# Patient Record
Sex: Male | Born: 1960 | Race: Asian | Hispanic: No | Marital: Married | Smoking: Never smoker
Health system: Southern US, Community
[De-identification: ages and names within clinical notes are randomized; demographics above are authoritative.]

## PROBLEM LIST (undated history)

## (undated) DIAGNOSIS — I10 Essential (primary) hypertension: Secondary | ICD-10-CM

## (undated) HISTORY — PX: COLONOSCOPY: SHX5424

## (undated) HISTORY — PX: UPPER GASTROINTESTINAL ENDOSCOPY: SHX188

---

## 2003-06-07 ENCOUNTER — Emergency Department (HOSPITAL_COMMUNITY): Admission: AD | Admit: 2003-06-07 | Discharge: 2003-06-07 | Payer: Self-pay | Admitting: Family Medicine

## 2005-03-02 ENCOUNTER — Encounter: Admission: RE | Admit: 2005-03-02 | Discharge: 2005-03-02 | Payer: Self-pay | Admitting: Internal Medicine

## 2009-07-18 ENCOUNTER — Emergency Department (HOSPITAL_COMMUNITY): Admission: EM | Admit: 2009-07-18 | Discharge: 2009-07-18 | Payer: Self-pay | Admitting: Emergency Medicine

## 2009-11-03 ENCOUNTER — Encounter: Admission: RE | Admit: 2009-11-03 | Discharge: 2009-11-03 | Payer: Self-pay | Admitting: Specialist

## 2011-08-21 ENCOUNTER — Other Ambulatory Visit: Payer: Self-pay | Admitting: Internal Medicine

## 2011-08-21 DIAGNOSIS — R634 Abnormal weight loss: Secondary | ICD-10-CM

## 2011-08-27 ENCOUNTER — Ambulatory Visit
Admission: RE | Admit: 2011-08-27 | Discharge: 2011-08-27 | Disposition: A | Discharge status: Home / Self Care | Payer: BC Managed Care – PPO | Source: Ambulatory Visit | Attending: Internal Medicine | Admitting: Internal Medicine

## 2011-08-27 DIAGNOSIS — R634 Abnormal weight loss: Secondary | ICD-10-CM

## 2011-08-27 MED ORDER — IOHEXOL 300 MG/ML  SOLN
100.0000 mL | Freq: Once | INTRAMUSCULAR | Status: AC | PRN
  Administered 2011-08-27: 100 mL via INTRAVENOUS

## 2013-03-09 IMAGING — CT CT ABDOMEN W/ CM
3 of 5 series · 13 of 36 positions shown, 19 images · IV contrast (omnipaque)
Comparison: None.

CLINICAL DATA: Weight loss.

CT ABDOMEN WITH CONTRAST
TECHNIQUE: Multidetector CT imaging of the abdomen was performed
using the standard protocol following bolus administration of
intravenous contrast.
Contrast: 100mL OMNIPAQUE IOHEXOL 300 MG/ML  SOLN

[Series 3: abdomen with · axial · 0.70mm/px · z∈[-194,-64]mm · 4 of 44 slices shown, 9 images]
[im 9/44  soft-tissue]
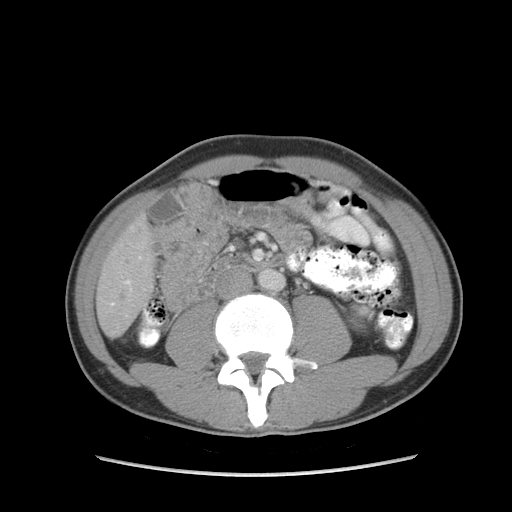
[im 9/44  lung]
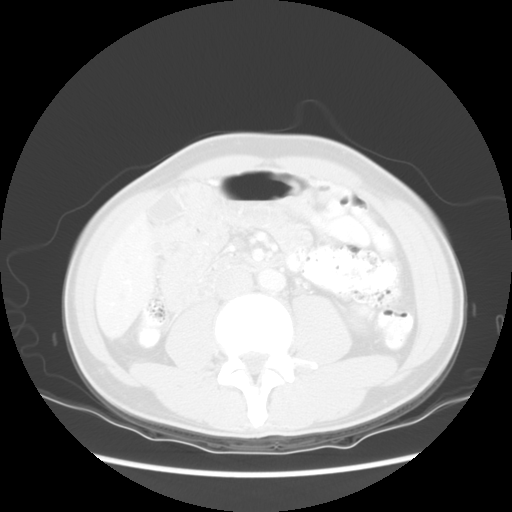
[im 9/44  bone]
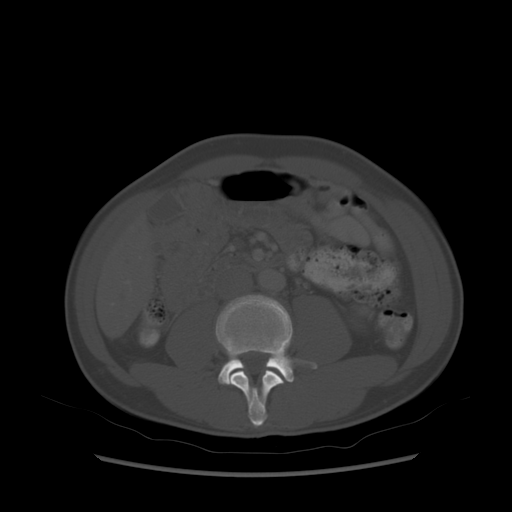
[im 18/44  soft-tissue]
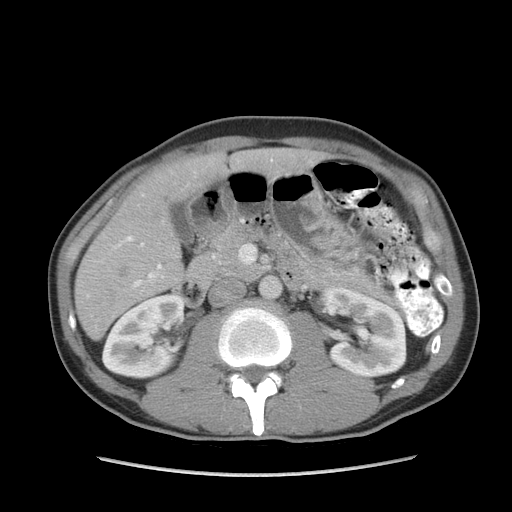
[im 18/44  lung]
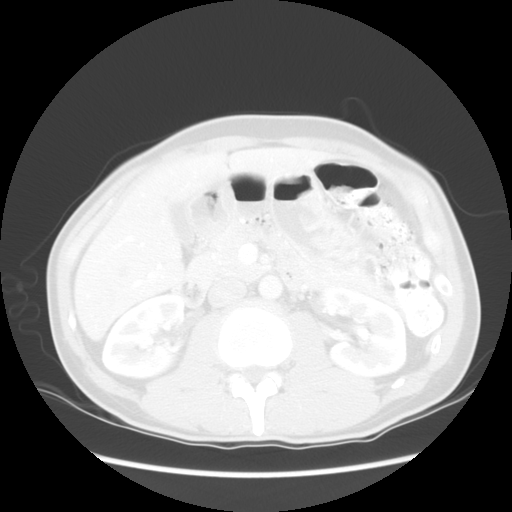
[im 26/44  soft-tissue]
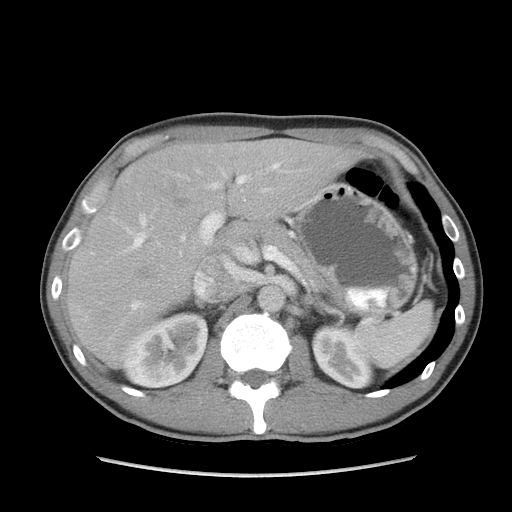
[im 26/44  lung]
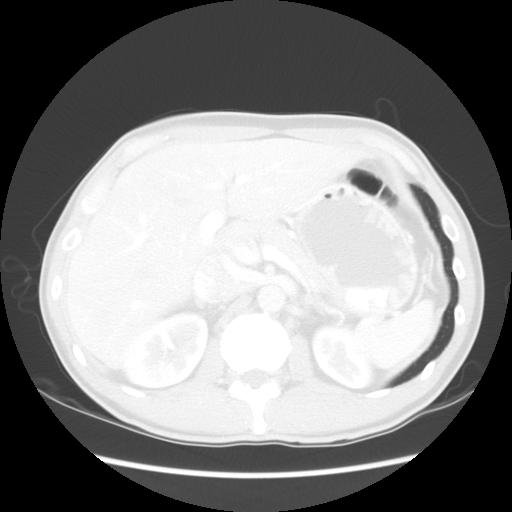
[im 35/44  soft-tissue]
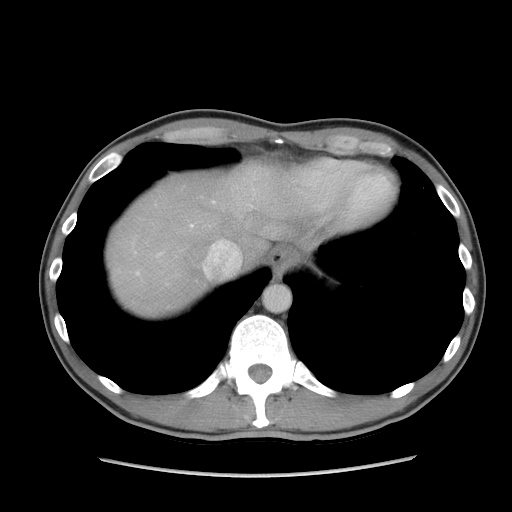
[im 35/44  lung]
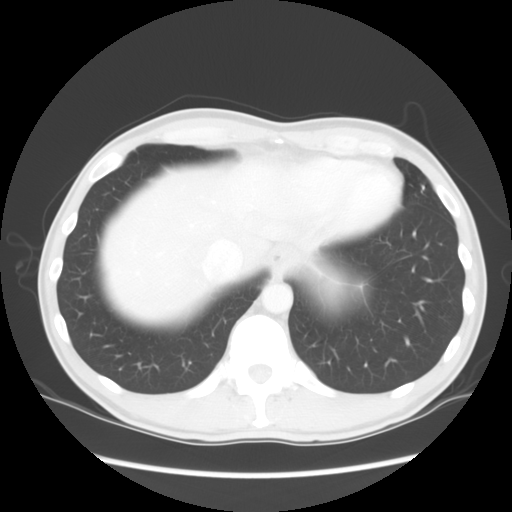

[Series 601: coronal body · coronal · 0.70mm/px · 1 of 100 slices shown, 2 images]
[im 34/100  soft-tissue]
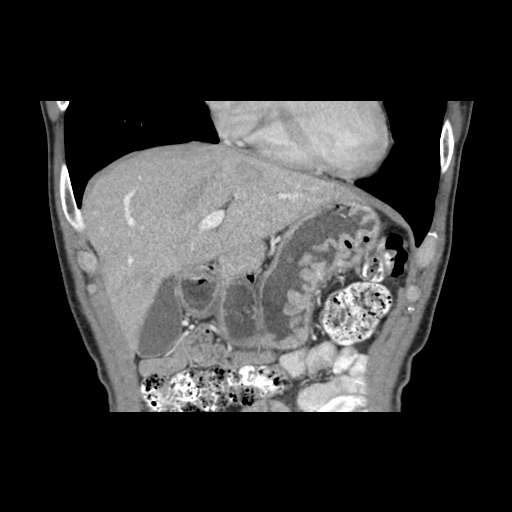
[im 34/100  bone]
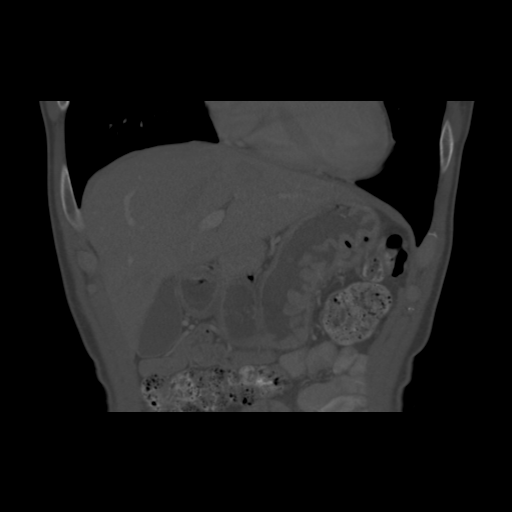

[Series 602: sagittal body · sagittal · 0.70mm/px · 8 of 131 slices shown]
[im 15/131  soft-tissue]
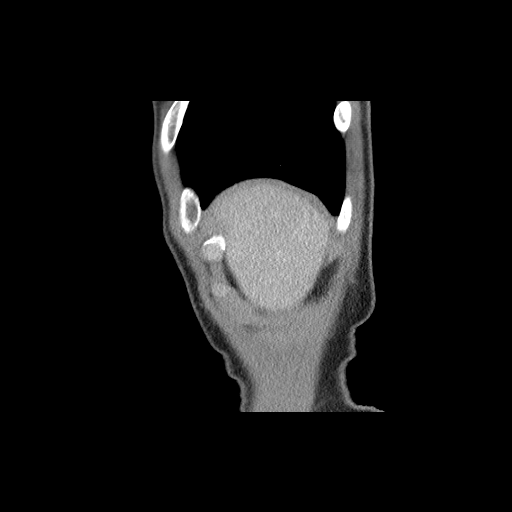
[im 29/131  soft-tissue]
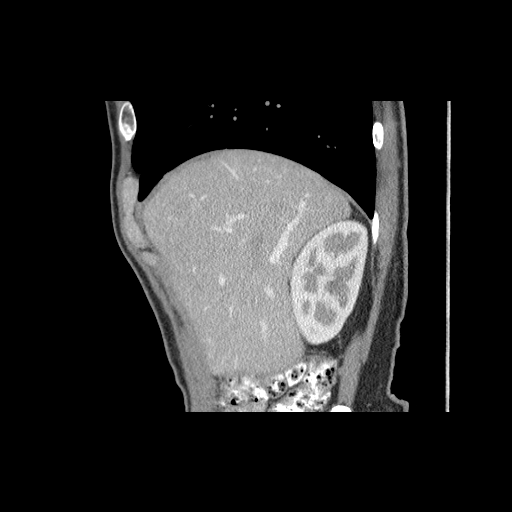
[im 44/131  soft-tissue]
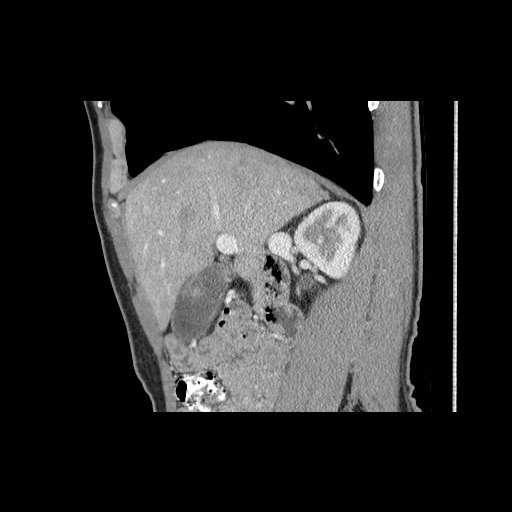
[im 58/131  soft-tissue]
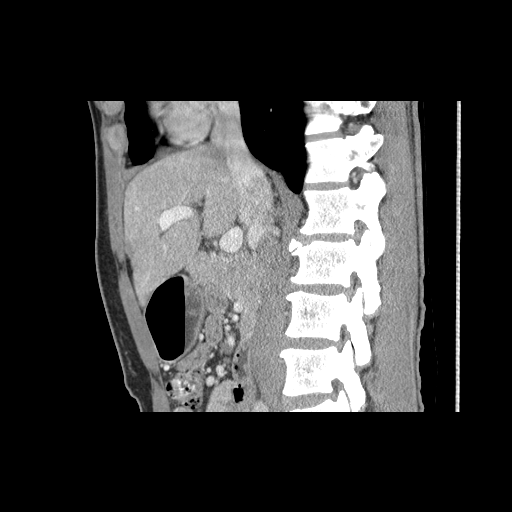
[im 73/131  soft-tissue]
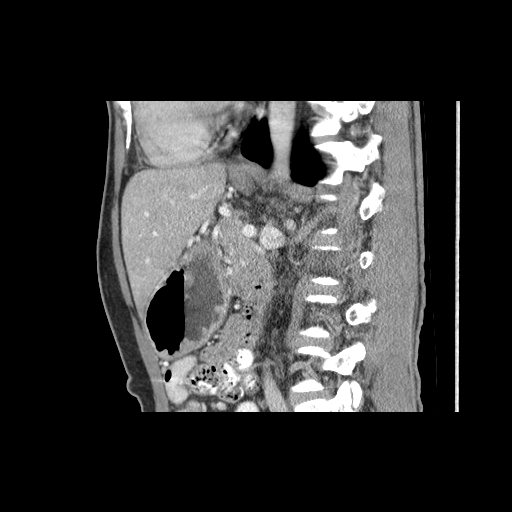
[im 87/131  soft-tissue]
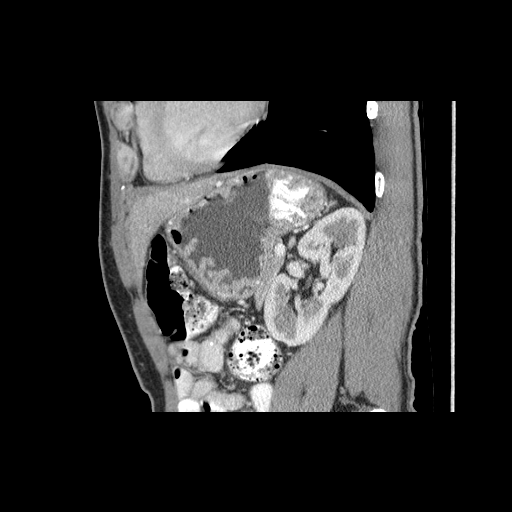
[im 102/131  soft-tissue]
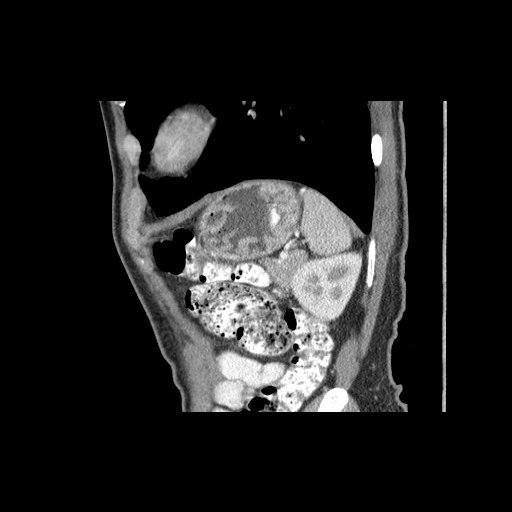
[im 116/131  soft-tissue]
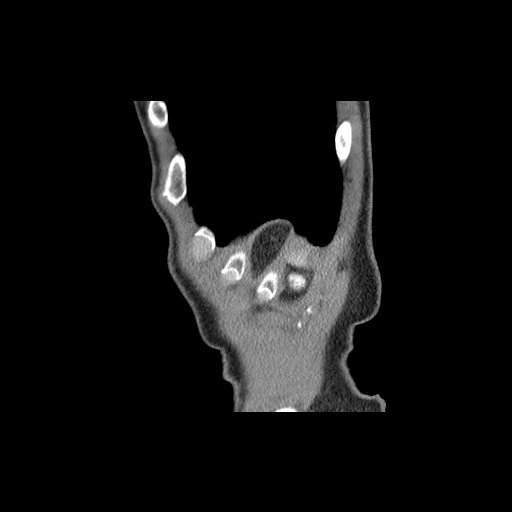

[13 of 36 positions shown; findings below may reference images not displayed]

FINDINGS: Lung bases show a 4 mm nodule in the right middle lobe
(image 4).  Heart size normal.  No pericardial or pleural effusion.

Liver, gallbladder and right adrenal gland are unremarkable.  There
may be a 1.4 x 0.8 cm nodule in the left adrenal gland.  Kidneys,
spleen, pancreas, stomach and visualized bowel are otherwise
unremarkable.  No pathologically enlarged lymph nodes.  No free
fluid.  No worrisome lytic or sclerotic lesions.
IMPRESSION: 1.  No acute findings.  No findings to explain the patient's weight
loss.
2.  Possible left adrenal nodule, indeterminate.
3.  Tiny right middle lobe nodule. If the patient is at high risk
for bronchogenic carcinoma, follow-up chest CT at 1 year is
recommended.  If the patient is at low risk, no follow-up is
needed.  This recommendation follows the consensus statement:
Guidelines for Management of Small Pulmonary Nodules Detected on CT
Scans:  A Statement from the [HOSPITAL] as published in

## 2014-09-29 ENCOUNTER — Encounter (HOSPITAL_COMMUNITY): Payer: Self-pay | Admitting: *Deleted

## 2014-09-29 ENCOUNTER — Emergency Department (HOSPITAL_COMMUNITY)
Admission: EM | Admit: 2014-09-29 | Discharge: 2014-09-29 | Disposition: A | Discharge status: Home / Self Care | Payer: BLUE CROSS/BLUE SHIELD | Source: Home / Self Care | Attending: Family Medicine | Admitting: Family Medicine

## 2014-09-29 DIAGNOSIS — B029 Zoster without complications: Secondary | ICD-10-CM | POA: Diagnosis not present

## 2014-09-29 HISTORY — DX: Essential (primary) hypertension: I10

## 2014-09-29 MED ORDER — TRAMADOL HCL 50 MG PO TABS: 50.0000 mg | ORAL_TABLET | Freq: Four times a day (QID) | ORAL | Status: DC | PRN

## 2014-09-29 MED ORDER — VALACYCLOVIR HCL 1 G PO TABS: 1000.0000 mg | ORAL_TABLET | Freq: Three times a day (TID) | ORAL | Status: AC

## 2014-09-29 NOTE — ED Provider Notes (Signed)
CSN: [id number]     Arrival date & time 09/29/14  1037 History   First MD Initiated Contact with Patient 09/29/14 1256     Chief Complaint  Patient presents with  . Back Pain   (Consider location/radiation/quality/duration/timing/severity/associated sxs/prior Treatment) HPI Comments: Developed nonspecific back discomfort on 09/27/2014 and discovered erythematous vesicular rash on left torso yesterday. Hx of shingles 20 years ago. Recently returned from multiple international business trips. PCP: Dr. Donette LarryHusain  Patient is a 54 y.o. male presenting with back pain. The history is provided by the patient.  Back Pain   Past Medical History  Diagnosis Date  . Hypertension    History reviewed. No pertinent past surgical history. History reviewed. No pertinent family history. History  Substance Use Topics  . Smoking status: Never Smoker   . Smokeless tobacco: Not on file  . Alcohol Use: Yes    Review of Systems  Musculoskeletal: Positive for back pain.  All other systems reviewed and are negative.   Allergies  Review of patient's allergies indicates no known allergies.  Home Medications   Prior to Admission medications   Medication Sig Start Date End Date Taking? Authorizing Provider  telmisartan (MICARDIS) 40 MG tablet Take 40 mg by mouth daily.   Yes Historical Provider, MD  traMADol (ULTRAM) 50 MG tablet Take 1 tablet (50 mg total) by mouth every 6 (six) hours as needed for moderate pain or severe pain. 09/29/14   Mathis FareJennifer Lee H Amariss Detamore, PA  valACYclovir (VALTREX) 1000 MG tablet Take 1 tablet (1,000 mg total) by mouth 3 (three) times daily. 09/29/14 10/13/14  Jess BartersJennifer Lee H Alicea Wente, PA   BP 124/74 mmHg  Pulse 78  Temp(Src) 98.6 F (37 C) (Oral)  Resp 18  SpO2 100% Physical Exam  Constitutional: He is oriented to person, place, and time. He appears well-developed and well-nourished. No distress.  HENT:  Head: Normocephalic and atraumatic.  Cardiovascular: Normal rate.    Pulmonary/Chest: Effort normal.  Musculoskeletal: Normal range of motion.  Neurological: He is alert and oriented to person, place, and time.  Skin: Skin is warm and dry. Rash noted.  Linear distribution of vesicular rash on erythematous base along left L1 dermatome  Psychiatric: He has a normal mood and affect. His behavior is normal.  Nursing note reviewed.   ED Course  Procedures (including critical care time) Labs Review Labs Reviewed - No data to display  Imaging Review No results found.   MDM   1. Herpes zoster   Valtrex Tramadol pcp follow up  Ria ClockJennifer Lee H Blayke Cordrey, PA 09/29/14 1347

## 2014-09-29 NOTE — Discharge Instructions (Signed)

## 2014-09-29 NOTE — ED Notes (Signed)
Pt  Reports   Symptoms  Of   A  Rash  That  Runs  Around  l  Side      And  l  Hip area      That  He  Noticed  Sv  Days  Ago -   He  Also  Reports       Some  Back  Pain                   As  Well     Recent     Travel  To  [place]         The  Rash   Does  Not  Really  Itch       He  Has  Had  Shingles  In  Past

## 2014-09-29 NOTE — ED Notes (Signed)
This Clinical research associatewriter charted in error on this patient

## 2015-07-14 ENCOUNTER — Other Ambulatory Visit: Payer: Self-pay | Admitting: Gastroenterology

## 2015-08-19 ENCOUNTER — Encounter (HOSPITAL_COMMUNITY): Payer: Self-pay | Admitting: *Deleted

## 2015-08-22 ENCOUNTER — Ambulatory Visit (HOSPITAL_COMMUNITY): Payer: BLUE CROSS/BLUE SHIELD | Admitting: Certified Registered Nurse Anesthetist

## 2015-08-22 ENCOUNTER — Ambulatory Visit (HOSPITAL_COMMUNITY)
Admission: RE | Admit: 2015-08-22 | Discharge: 2015-08-22 | Disposition: A | Discharge status: Home / Self Care | Payer: BLUE CROSS/BLUE SHIELD | Source: Ambulatory Visit | Attending: Gastroenterology | Admitting: Gastroenterology

## 2015-08-22 ENCOUNTER — Encounter (HOSPITAL_COMMUNITY)
Admission: RE | Disposition: A | Discharge status: Home / Self Care | Payer: Self-pay | Source: Ambulatory Visit | Attending: Gastroenterology

## 2015-08-22 ENCOUNTER — Encounter (HOSPITAL_COMMUNITY): Payer: Self-pay

## 2015-08-22 DIAGNOSIS — Z1211 Encounter for screening for malignant neoplasm of colon: Secondary | ICD-10-CM | POA: Insufficient documentation

## 2015-08-22 DIAGNOSIS — Z79899 Other long term (current) drug therapy: Secondary | ICD-10-CM | POA: Insufficient documentation

## 2015-08-22 DIAGNOSIS — I10 Essential (primary) hypertension: Secondary | ICD-10-CM | POA: Diagnosis not present

## 2015-08-22 HISTORY — PX: COLONOSCOPY WITH PROPOFOL: SHX5780

## 2015-08-22 SURGERY — COLONOSCOPY WITH PROPOFOL
Anesthesia: Monitor Anesthesia Care

## 2015-08-22 MED ORDER — LIDOCAINE HCL (CARDIAC) 20 MG/ML IV SOLN
INTRAVENOUS | Status: AC
  Filled 2015-08-22: qty 10

## 2015-08-22 MED ORDER — PROPOFOL 10 MG/ML IV BOLUS
INTRAVENOUS | Status: AC
  Filled 2015-08-22: qty 20

## 2015-08-22 MED ORDER — ONDANSETRON HCL 4 MG/2ML IJ SOLN
INTRAMUSCULAR | Status: DC | PRN
  Administered 2015-08-22: 4 mg via INTRAVENOUS

## 2015-08-22 MED ORDER — LACTATED RINGERS IV SOLN
INTRAVENOUS | Status: DC | PRN
  Administered 2015-08-22: 13:00:00 via INTRAVENOUS

## 2015-08-22 MED ORDER — LIDOCAINE HCL (CARDIAC) 20 MG/ML IV SOLN
INTRAVENOUS | Status: DC | PRN
  Administered 2015-08-22: 100 mg via INTRAVENOUS

## 2015-08-22 MED ORDER — SODIUM CHLORIDE 0.9 % IV SOLN: INTRAVENOUS | Status: DC

## 2015-08-22 MED ORDER — PROPOFOL 10 MG/ML IV BOLUS
INTRAVENOUS | Status: AC
  Filled 2015-08-22: qty 40

## 2015-08-22 MED ORDER — EPHEDRINE SULFATE 50 MG/ML IJ SOLN
INTRAMUSCULAR | Status: DC | PRN
  Administered 2015-08-22: 5 mg via INTRAVENOUS

## 2015-08-22 MED ORDER — LIDOCAINE HCL (CARDIAC) 20 MG/ML IV SOLN
INTRAVENOUS | Status: AC
  Filled 2015-08-22: qty 5

## 2015-08-22 MED ORDER — PROPOFOL 10 MG/ML IV BOLUS
INTRAVENOUS | Status: DC | PRN
  Administered 2015-08-22 (×2): 10 mg via INTRAVENOUS
  Administered 2015-08-22 (×2): 20 mg via INTRAVENOUS

## 2015-08-22 MED ORDER — PROPOFOL 500 MG/50ML IV EMUL
INTRAVENOUS | Status: DC | PRN
  Administered 2015-08-22: 75 ug/kg/min via INTRAVENOUS

## 2015-08-22 MED ORDER — ONDANSETRON HCL 4 MG/2ML IJ SOLN
INTRAMUSCULAR | Status: AC
  Filled 2015-08-22: qty 2

## 2015-08-22 SURGICAL SUPPLY — 21 items

## 2015-08-22 NOTE — Anesthesia Preprocedure Evaluation (Signed)
Anesthesia Evaluation  Patient identified by MRN, date of birth, ID band Patient awake    Reviewed: Allergy & Precautions, NPO status , Patient's Chart, lab work & pertinent test results  Airway Mallampati: II  TM Distance: >3 FB Neck ROM: Full    Dental no notable dental hx.    Pulmonary neg pulmonary ROS,    Pulmonary exam normal breath sounds clear to auscultation       Cardiovascular hypertension, Pt. on medications negative cardio ROS Normal cardiovascular exam Rhythm:Regular Rate:Normal     Neuro/Psych negative neurological ROS  negative psych ROS   GI/Hepatic negative GI ROS, Neg liver ROS,   Endo/Other  negative endocrine ROS  Renal/GU negative Renal ROS  negative genitourinary   Musculoskeletal negative musculoskeletal ROS (+)   Abdominal   Peds negative pediatric ROS (+)  Hematology negative hematology ROS (+)   Anesthesia Other Findings   Reproductive/Obstetrics negative OB ROS                             Anesthesia Physical Anesthesia Plan  ASA: II  Anesthesia Plan: MAC   Post-op Pain Management:    Induction: Intravenous  Airway Management Planned: Simple Face Mask  Additional Equipment:   Intra-op Plan:   Post-operative Plan: Extubation in OR  Informed Consent: I have reviewed the patients History and Physical, chart, labs and discussed the procedure including the risks, benefits and alternatives for the proposed anesthesia with the patient or authorized representative who has indicated his/her understanding and acceptance.   Dental advisory given  Plan Discussed with: CRNA  Anesthesia Plan Comments:         Anesthesia Quick Evaluation

## 2015-08-22 NOTE — Anesthesia Postprocedure Evaluation (Signed)
Anesthesia Post Note  Patient: Charm RingsYasushi Eisenberg  Procedure(s) Performed: Procedure(s) (LRB): COLONOSCOPY WITH PROPOFOL (N/A)  Patient location during evaluation: Endoscopy Anesthesia Type: MAC Level of consciousness: awake and alert Pain management: pain level controlled Vital Signs Assessment: post-procedure vital signs reviewed and stable Respiratory status: spontaneous breathing, nonlabored ventilation, respiratory function stable and patient connected to nasal cannula oxygen Cardiovascular status: stable and blood pressure returned to baseline Anesthetic complications: no    Last Vitals:  Filed Vitals:   08/22/15 1320 08/22/15 1330  BP: 105/60 126/70  Pulse: 51 61  Temp:    Resp: 13 18    Last Pain: There were no vitals filed for this visit.               Phillips Groutarignan, Jeramy Dimmick

## 2015-08-22 NOTE — H&P (Signed)
  Procedure: Screening colonoscopy. Normal screening colonoscopy performed on 12/25/2005  History: The patient is a 55 year old male born [birth year]. He is scheduled to undergo a repeat screening colonoscopy today  Past medical history: Hypertension. Gastroesophageal reflux. H. pylori gastritis diagnosed in July 2013.  Exam: Patient is alert and lying comfortably on the endoscopy stretcher. Abdomen is soft and nontender to palpation. Lungs are clear to auscultation. Cardiac exam reveals a regular rhythm.  Plan: Proceed with screening colonoscopy

## 2015-08-22 NOTE — Transfer of Care (Signed)
Immediate Anesthesia Transfer of Care Note  Patient: Cory Griffith  Procedure(s) Performed: Procedure(s): COLONOSCOPY WITH PROPOFOL (N/A)  Patient Location: ENDO  Anesthesia Type:MAC  Level of Consciousness:  sedated, patient cooperative and responds to stimulation  Airway & Oxygen Therapy:Patient Spontanous Breathing and Patient connected to face mask oxgen  Post-op Assessment:  Report given to ENDO RN and Post -op Vital signs reviewed and stable  Post vital signs:  Reviewed and stable  Last Vitals:  Filed Vitals:   08/22/15 1133  BP: 143/70  Pulse: 69  Temp: 37 C  Resp: 16    Complications: No apparent anesthesia complications

## 2015-08-22 NOTE — Op Note (Signed)
[facility] Patient Name: Cory Griffith Procedure Date: 08/22/2015 MRN: [id number] Attending MD: Charolett Bumpers , MD Date of Birth: 08-23-1960 CSN:  Age: 55 Admit Type: Outpatient Procedure:                Colonoscopy Indications:              Screening for colorectal malignant neoplasm. Normal                            screening colonoscopy was performed on 12/25/2005. Providers:                Charolett Bumpers, MD, Omelia Blackwater, RN, Oletha Blend, Technician Referring MD:              Medicines:                Propofol per Anesthesia Complications:            No immediate complications. Estimated Blood Loss:     Estimated blood loss: none. Procedure:                Pre-Anesthesia Assessment:                           - Prior to the procedure, a History and Physical                            was performed, and patient medications and                            allergies were reviewed. The patient's tolerance of                            previous anesthesia was also reviewed. The risks                            and benefits of the procedure and the sedation                            options and risks were discussed with the patient.                            All questions were answered, and informed consent                            was obtained. Prior Anticoagulants: The patient has                            taken no previous anticoagulant or antiplatelet                            agents. ASA Grade Assessment: II - A patient with  mild systemic disease. After reviewing the risks                            and benefits, the patient was deemed in                            satisfactory condition to undergo the procedure.                           After obtaining informed consent, the colonoscope                            was passed under direct vision. Throughout the   procedure, the patient's blood pressure, pulse, and                            oxygen saturations were monitored continuously. The                            EC-[id number] ([id number]) scope was introduced through                            the anus and advanced to the the cecum, identified                            by appendiceal orifice and ileocecal valve. The                            colonoscopy was performed without difficulty. The                            patient tolerated the procedure well. The quality                            of the bowel preparation was good. The appendiceal                            orifice and the rectum were photographed. Scope In: 12:47:02 PM Scope Out: 1:06:45 PM Scope Withdrawal Time: 0 hours 7 minutes 23 seconds  Total Procedure Duration: 0 hours 19 minutes 43 seconds  Findings:      The perianal and digital rectal examinations were normal.      The entire examined colon appeared normal. Impression:               - The entire examined colon is normal.                           - No specimens collected. Moderate Sedation:      N/A- Per Anesthesia Care Recommendation:           - Repeat colonoscopy in 10 years for screening                            purposes.                           -  Patient has a contact number available for                            emergencies. The signs and symptoms of potential                            delayed complications were discussed with the                            patient. Return to normal activities tomorrow.                            Written discharge instructions were provided to the                            patient.                           - Resume previous diet.                           - Continue present medications. Procedure Code(s):        --- Professional ---                           [id number], Colonoscopy, flexible; diagnostic, including                            collection of specimen(s) by  brushing or washing,                            when performed (separate procedure) Diagnosis Code(s):        --- Professional ---                           Z12.11, Encounter for screening for malignant                            neoplasm of colon CPT copyright 2016 [employer]. All rights reserved. The codes documented in this report are preliminary and upon coder review may  be revised to meet current compliance requirements. Danise EdgeMartin Johnson, MD Charolett BumpersMartin K Johnson, MD 08/22/2015 1:11:59 PM This report has been signed electronically. Number of Addenda: 0

## 2015-08-22 NOTE — Discharge Instructions (Signed)

## 2015-08-24 ENCOUNTER — Encounter (HOSPITAL_COMMUNITY): Payer: Self-pay | Admitting: Gastroenterology

## 2016-07-11 ENCOUNTER — Ambulatory Visit (INDEPENDENT_AMBULATORY_CARE_PROVIDER_SITE_OTHER): Payer: BLUE CROSS/BLUE SHIELD | Admitting: Podiatry

## 2016-07-11 VITALS — Resp 16

## 2016-07-11 DIAGNOSIS — B351 Tinea unguium: Secondary | ICD-10-CM

## 2016-07-11 NOTE — Progress Notes (Signed)
Subjective:     Patient ID: Cory RingsYasushi Griffith, male   DOB: [birth year], 58 y.o.   MRN: [id number]  HPI patient presents with discolored nails on the left one through 5 that are dystrophic and thickened. States that this is been going on for a long time   Review of Systems  All other systems reviewed and are negative.      Objective:   Physical Exam  Constitutional: He is oriented to person, place, and time.  Cardiovascular: Intact distal pulses.   Musculoskeletal: Normal range of motion.  Neurological: He is oriented to person, place, and time.  Skin: Skin is warm and dry.  Nursing note and vitals reviewed.  neurovascular status intact muscle strength was adequate with patient found to have yellow dystrophic nailbeds 1 through 5 left with thickening of the beds and mild odor. There is no proximal redness or change and patient's found have good digital perfusion well oriented 3     Assessment:     To be mycotic nail infection 1 through 5 left    Plan:     Reviewed condition and did H&P and at this point I did go ahead and send off for pathology and cultures and did cultures today and pathology today. I will get results we will determine what may be necessary and may require combination of oral therapy fungal therapy and topical

## 2016-08-09 ENCOUNTER — Ambulatory Visit (INDEPENDENT_AMBULATORY_CARE_PROVIDER_SITE_OTHER): Payer: BLUE CROSS/BLUE SHIELD | Admitting: Podiatry

## 2016-08-09 ENCOUNTER — Encounter: Payer: Self-pay | Admitting: Podiatry

## 2016-08-09 DIAGNOSIS — B351 Tinea unguium: Secondary | ICD-10-CM

## 2016-08-09 LAB — HEPATIC FUNCTION PANEL
ALK PHOS: 49 U/L (ref 40–115)
ALT: 17 U/L (ref 9–46)
AST: 26 U/L (ref 10–35)
Albumin: 4 g/dL (ref 3.6–5.1)
BILIRUBIN DIRECT: 0.2 mg/dL (ref <=0.2)
BILIRUBIN TOTAL: 0.8 mg/dL (ref 0.2–1.2)
Indirect Bilirubin: 0.6 mg/dL (ref 0.2–1.2)
Total Protein: 7 g/dL (ref 6.1–8.1)

## 2016-08-09 MED ORDER — TERBINAFINE HCL 250 MG PO TABS: 250.0000 mg | ORAL_TABLET | Freq: Every day | ORAL | 0 refills | Status: AC

## 2016-08-10 NOTE — Progress Notes (Signed)
Subjective:     Patient ID: Cory Griffith, male   DOB: May 29, 1960, 56 y.o.   MRN: [id number]  HPI patient states he is here to review the results of his cultures and to decide on a treatment plan   Review of Systems     Objective:   Physical Exam Neurovascular status intact muscle strength was adequate patient found to have yellow brittle nails 1-5 both feet that are mostly on the distal one half of the nailbed as far as involvement    Assessment:     Mycotic nail infection bilateral    Plan:     Reviewed findings indicating fungus. I have recommended antifungal and I discussed Lamisil and risk and he wants this done and I did sent for liver function study. I then discussed laser and topical and he wants to pursue this course and he is scheduled for laser therapy to be started in the next week with approximate 6 treatments which will be necessary

## 2016-08-15 ENCOUNTER — Ambulatory Visit (INDEPENDENT_AMBULATORY_CARE_PROVIDER_SITE_OTHER): Payer: Self-pay

## 2016-08-15 DIAGNOSIS — B351 Tinea unguium: Secondary | ICD-10-CM

## 2016-08-16 NOTE — Progress Notes (Signed)
Pt presents with mycotic infection of nails 1-5 bilateral  All other systems are negative  Laser therapy administered to affected nails and tolerated well. All safety precautions were in place.Re-appointed in 1 month for 2nd treatment 

## 2016-09-13 ENCOUNTER — Other Ambulatory Visit: Payer: BLUE CROSS/BLUE SHIELD

## 2016-09-14 ENCOUNTER — Ambulatory Visit (INDEPENDENT_AMBULATORY_CARE_PROVIDER_SITE_OTHER): Payer: Self-pay | Admitting: Podiatry

## 2016-09-14 DIAGNOSIS — R52 Pain, unspecified: Secondary | ICD-10-CM

## 2016-09-14 DIAGNOSIS — B351 Tinea unguium: Secondary | ICD-10-CM

## 2016-09-14 NOTE — Progress Notes (Signed)
Pt presents with mycotic infection of nails 1-5 bilateral   All other systems are negative  Laser therapy administered to affected nails and tolerated well. All safety precautions were in place. Re-appointed in 1 month for 3rd treatment 

## 2016-10-15 ENCOUNTER — Ambulatory Visit: Payer: Self-pay | Admitting: Podiatry

## 2016-10-15 DIAGNOSIS — B351 Tinea unguium: Secondary | ICD-10-CM

## 2016-10-30 NOTE — Progress Notes (Signed)
Pt presents with mycotic infection of nails 1-5 bilateral  All other systems are negative  Laser therapy administered to affected nails and tolerated well. All safety precautions were in place.Re-appointed in 1 month for 4th treatment 

## 2016-11-12 ENCOUNTER — Ambulatory Visit (INDEPENDENT_AMBULATORY_CARE_PROVIDER_SITE_OTHER): Payer: BLUE CROSS/BLUE SHIELD | Admitting: Podiatry

## 2016-11-12 DIAGNOSIS — B351 Tinea unguium: Secondary | ICD-10-CM

## 2016-11-28 NOTE — Progress Notes (Signed)
Pt presents with mycotic infection of nails 1-5 bilateral  All other systems are negative  Laser therapy administered to affected nails and tolerated well. All safety precautions were in place. Re-appointed in 1 month for 5th and final treatment

## 2016-12-25 ENCOUNTER — Other Ambulatory Visit: Payer: BLUE CROSS/BLUE SHIELD

## 2016-12-25 DIAGNOSIS — B351 Tinea unguium: Secondary | ICD-10-CM

## 2016-12-31 ENCOUNTER — Ambulatory Visit (INDEPENDENT_AMBULATORY_CARE_PROVIDER_SITE_OTHER): Payer: BLUE CROSS/BLUE SHIELD | Admitting: Podiatry

## 2016-12-31 DIAGNOSIS — B351 Tinea unguium: Secondary | ICD-10-CM

## 2017-01-07 NOTE — Progress Notes (Signed)
Pt presents with mycotic infection of nails 1-5 bilateral  All other systems are negative  Laser therapy administered to affected nails and tolerated well. All safety precautions were in place. Re-appointed prn
